# Patient Record
Sex: Male | Born: 1980 | Race: White | Hispanic: No | Marital: Married | Smoking: Current every day smoker
Health system: Southern US, Community
[De-identification: ages and names within clinical notes are randomized; demographics above are authoritative.]

## PROBLEM LIST (undated history)

## (undated) HISTORY — PX: OTHER SURGICAL HISTORY: SHX169

## (undated) HISTORY — PX: ABDOMINAL SURGERY: SHX537

---

## 1998-06-25 ENCOUNTER — Emergency Department (HOSPITAL_COMMUNITY): Admission: EM | Admit: 1998-06-25 | Discharge: 1998-06-25 | Payer: Self-pay | Admitting: Emergency Medicine

## 1998-09-18 ENCOUNTER — Inpatient Hospital Stay (HOSPITAL_COMMUNITY): Admission: RE | Admit: 1998-09-18 | Discharge: 1998-09-23 | Payer: Self-pay | Admitting: General Surgery

## 1998-09-18 ENCOUNTER — Encounter: Payer: Self-pay | Admitting: General Surgery

## 1999-03-22 ENCOUNTER — Emergency Department (HOSPITAL_COMMUNITY): Admission: EM | Admit: 1999-03-22 | Discharge: 1999-03-22 | Payer: Self-pay | Admitting: Emergency Medicine

## 1999-03-22 ENCOUNTER — Encounter: Payer: Self-pay | Admitting: Emergency Medicine

## 2008-05-30 ENCOUNTER — Emergency Department (HOSPITAL_COMMUNITY): Admission: EM | Admit: 2008-05-30 | Discharge: 2008-05-31 | Payer: Self-pay | Admitting: Emergency Medicine

## 2008-08-30 ENCOUNTER — Emergency Department (HOSPITAL_COMMUNITY): Admission: EM | Admit: 2008-08-30 | Discharge: 2008-08-30 | Payer: Self-pay | Admitting: Emergency Medicine

## 2009-03-01 ENCOUNTER — Emergency Department (HOSPITAL_COMMUNITY): Admission: EM | Admit: 2009-03-01 | Discharge: 2009-03-01 | Payer: Self-pay | Admitting: Emergency Medicine

## 2009-03-05 ENCOUNTER — Emergency Department (HOSPITAL_COMMUNITY): Admission: EM | Admit: 2009-03-05 | Discharge: 2009-03-05 | Payer: Self-pay | Admitting: Family Medicine

## 2010-03-02 ENCOUNTER — Emergency Department (HOSPITAL_COMMUNITY): Admission: EM | Admit: 2010-03-02 | Discharge: 2010-03-02 | Payer: Self-pay | Admitting: Emergency Medicine

## 2010-03-04 ENCOUNTER — Ambulatory Visit: Payer: Self-pay | Admitting: Vascular Surgery

## 2010-03-04 ENCOUNTER — Inpatient Hospital Stay (HOSPITAL_COMMUNITY): Admission: AD | Admit: 2010-03-04 | Discharge: 2010-03-09 | Payer: Self-pay | Admitting: Orthopedic Surgery

## 2010-03-04 ENCOUNTER — Encounter (INDEPENDENT_AMBULATORY_CARE_PROVIDER_SITE_OTHER): Payer: Self-pay | Admitting: Orthopedic Surgery

## 2010-12-21 LAB — BASIC METABOLIC PANEL
BUN: 7 mg/dL (ref 6–23)
CO2: 26 mEq/L (ref 19–32)
Calcium: 8.6 mg/dL (ref 8.4–10.5)
Creatinine, Ser: 0.94 mg/dL (ref 0.4–1.5)
GFR calc Af Amer: >60 mL/min (ref >60)
GFR calc non Af Amer: >60 mL/min (ref >60)
Potassium: 4.2 mEq/L (ref 3.5–5.1)

## 2010-12-21 LAB — ETHANOL: Alcohol, Ethyl (B): 93 mg/dL — ABNORMAL HIGH (ref 0–10)

## 2010-12-21 LAB — CBC
HCT: 34.8 % — ABNORMAL LOW (ref 39.0–52.0)
HCT: 41.9 % (ref 39.0–52.0)
Hemoglobin: 12.1 g/dL — ABNORMAL LOW (ref 13.0–17.0)
MCHC: 34.7 g/dL (ref 30.0–36.0)
MCV: 99.7 fL (ref 78.0–100.0)
MCV: 99.3 fL (ref 78.0–100.0)
Platelets: 168 10*3/uL (ref 150–400)
Platelets: 249 10*3/uL (ref 150–400)
RBC: 3.49 MIL/uL — ABNORMAL LOW (ref 4.22–5.81)
RBC: 4.22 MIL/uL (ref 4.22–5.81)
RDW: 13.1 % (ref 11.5–15.5)
WBC: 9.8 10*3/uL (ref 4.0–10.5)

## 2010-12-21 LAB — COMPREHENSIVE METABOLIC PANEL
ALT: 15 U/L (ref 0–53)
AST: 22 U/L (ref 0–37)
Alkaline Phosphatase: 58 U/L (ref 39–117)
BUN: 8 mg/dL (ref 6–23)
CO2: 24 mEq/L (ref 19–32)
GFR calc Af Amer: >60 mL/min (ref >60)
GFR calc non Af Amer: >60 mL/min (ref >60)
Potassium: 3.3 mEq/L — ABNORMAL LOW (ref 3.5–5.1)
Sodium: 140 mEq/L (ref 135–145)
Total Bilirubin: 0.8 mg/dL (ref 0.3–1.2)
Total Protein: 7 g/dL (ref 6.0–8.3)

## 2010-12-21 LAB — DIFFERENTIAL
Eosinophils Absolute: 0.4 10*3/uL (ref 0.0–0.7)
Eosinophils Relative: 4 % (ref 0–5)
Lymphocytes Relative: 51 % — ABNORMAL HIGH (ref 12–46)
Lymphs Abs: 5.6 10*3/uL — ABNORMAL HIGH (ref 0.7–4.0)
Monocytes Absolute: 0.7 10*3/uL (ref 0.1–1.0)
Monocytes Relative: 6 % (ref 3–12)
Neutrophils Relative %: 39 % — ABNORMAL LOW (ref 43–77)

## 2010-12-21 LAB — PROTIME-INR
INR: 1.05 (ref 0.00–1.49)
Prothrombin Time: 13.6 seconds (ref 11.6–15.2)

## 2010-12-21 LAB — URINALYSIS, ROUTINE W REFLEX MICROSCOPIC
Bilirubin Urine: NEGATIVE
Ketones, ur: NEGATIVE mg/dL

## 2011-03-13 ENCOUNTER — Emergency Department (HOSPITAL_COMMUNITY)
Admission: EM | Admit: 2011-03-13 | Discharge: 2011-03-13 | Disposition: A | Discharge status: Home / Self Care | Payer: 59 | Attending: Emergency Medicine | Admitting: Emergency Medicine

## 2011-03-13 ENCOUNTER — Emergency Department (HOSPITAL_COMMUNITY): Payer: 59

## 2011-03-13 DIAGNOSIS — R234 Changes in skin texture: Secondary | ICD-10-CM | POA: Insufficient documentation

## 2011-03-13 DIAGNOSIS — R42 Dizziness and giddiness: Secondary | ICD-10-CM | POA: Insufficient documentation

## 2011-03-13 DIAGNOSIS — S0003XA Contusion of scalp, initial encounter: Secondary | ICD-10-CM | POA: Insufficient documentation

## 2011-03-13 DIAGNOSIS — R51 Headache: Secondary | ICD-10-CM | POA: Insufficient documentation

## 2011-03-13 DIAGNOSIS — Y9229 Other specified public building as the place of occurrence of the external cause: Secondary | ICD-10-CM | POA: Insufficient documentation

## 2011-03-13 DIAGNOSIS — S0990XA Unspecified injury of head, initial encounter: Secondary | ICD-10-CM | POA: Insufficient documentation

## 2011-03-13 DIAGNOSIS — R221 Localized swelling, mass and lump, neck: Secondary | ICD-10-CM | POA: Insufficient documentation

## 2011-03-13 DIAGNOSIS — IMO0002 Reserved for concepts with insufficient information to code with codable children: Secondary | ICD-10-CM | POA: Insufficient documentation

## 2011-03-13 DIAGNOSIS — S1093XA Contusion of unspecified part of neck, initial encounter: Secondary | ICD-10-CM | POA: Insufficient documentation

## 2011-03-13 DIAGNOSIS — R220 Localized swelling, mass and lump, head: Secondary | ICD-10-CM | POA: Insufficient documentation

## 2011-10-05 IMAGING — RF DG TIBIA/FIBULA 2V*L*
1 series · 4 of 4 positions shown · non-contrast
Comparison: 03/04/2010.

CLINICAL DATA: ORIF left tibial plateau fracture.

LEFT TIBIA AND FIBULA - 2 VIEW

[Series 1: run · 4 of 4 slices shown]
[im 1/4]
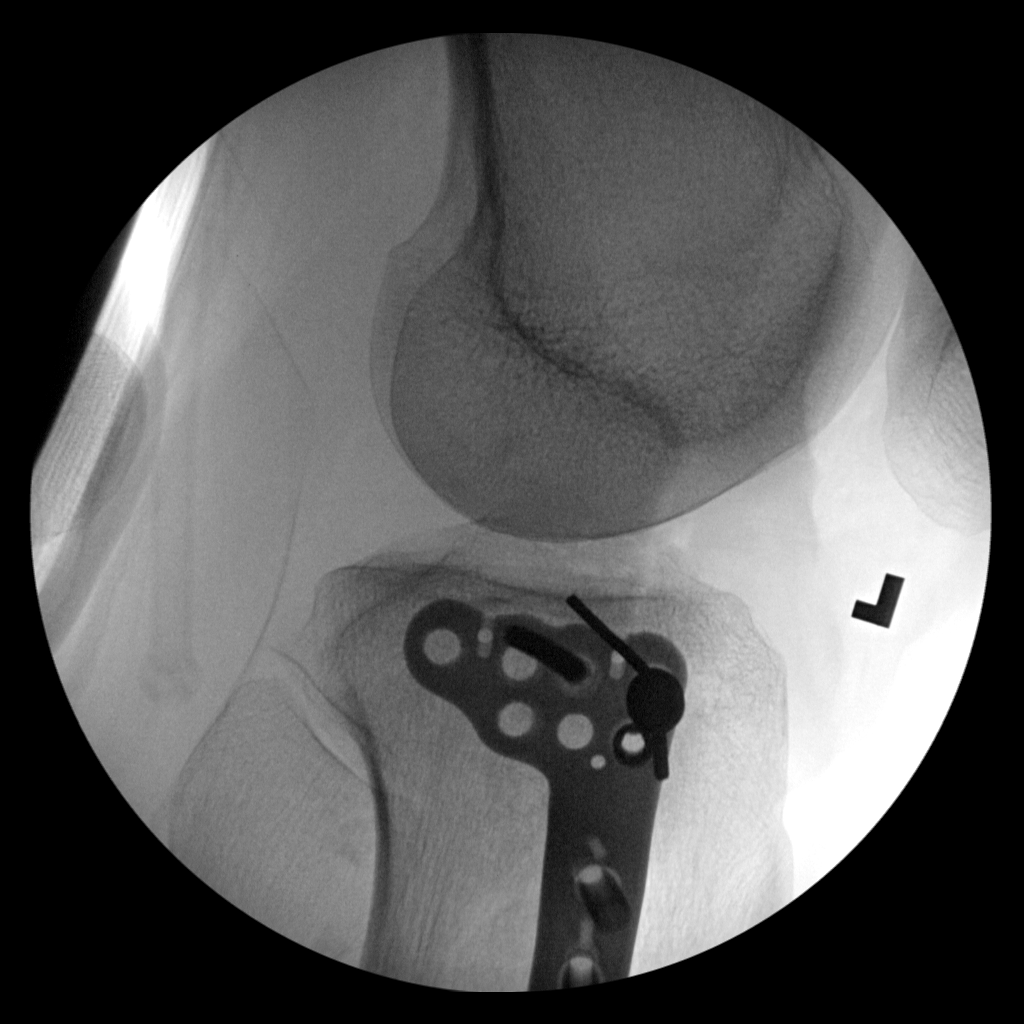
[im 2/4]
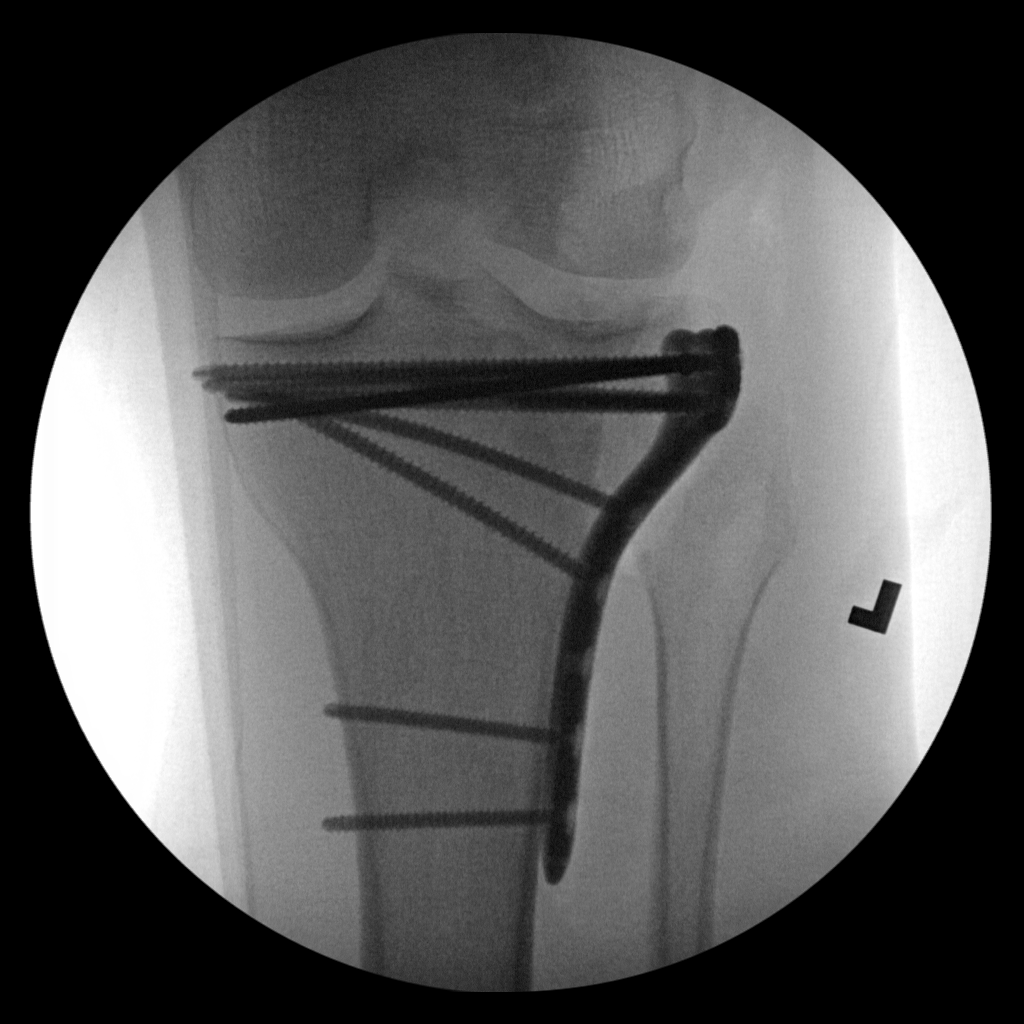
[im 3/4]
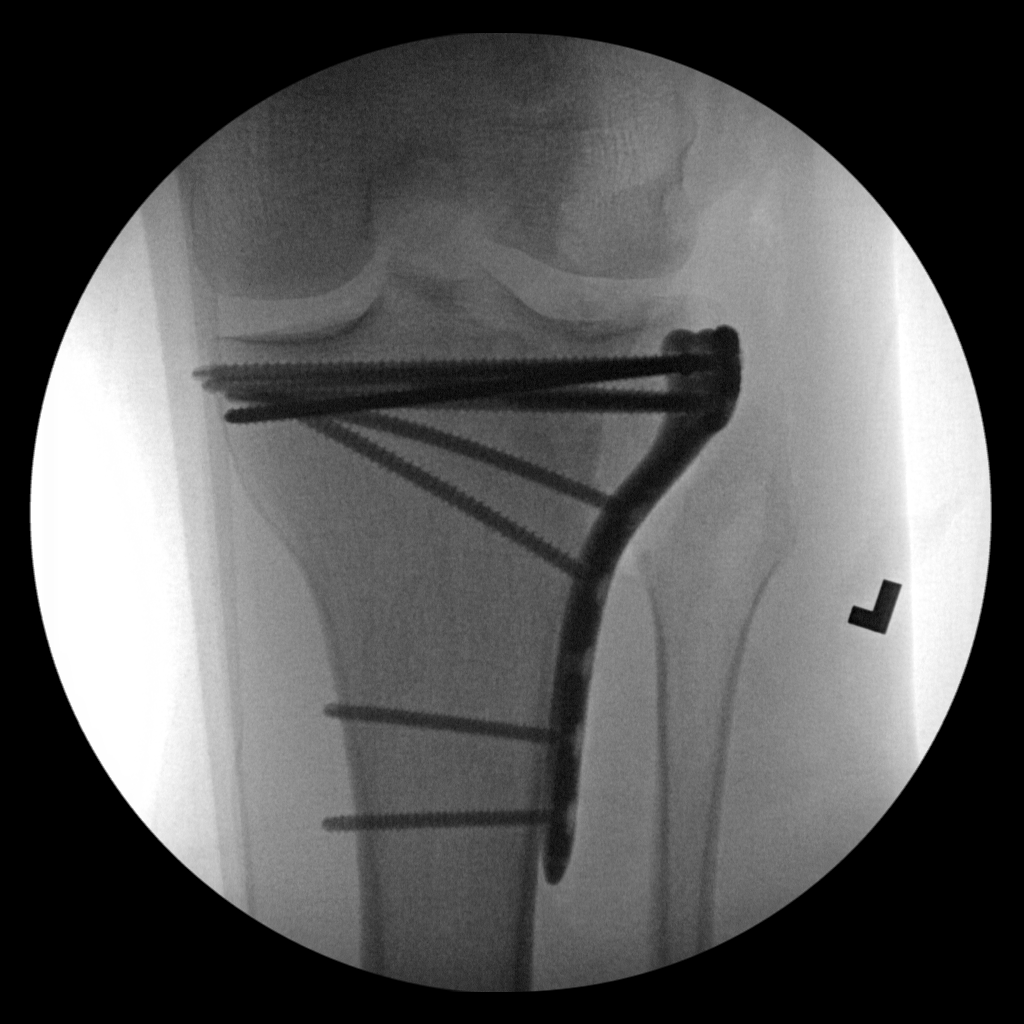
[im 4/4]
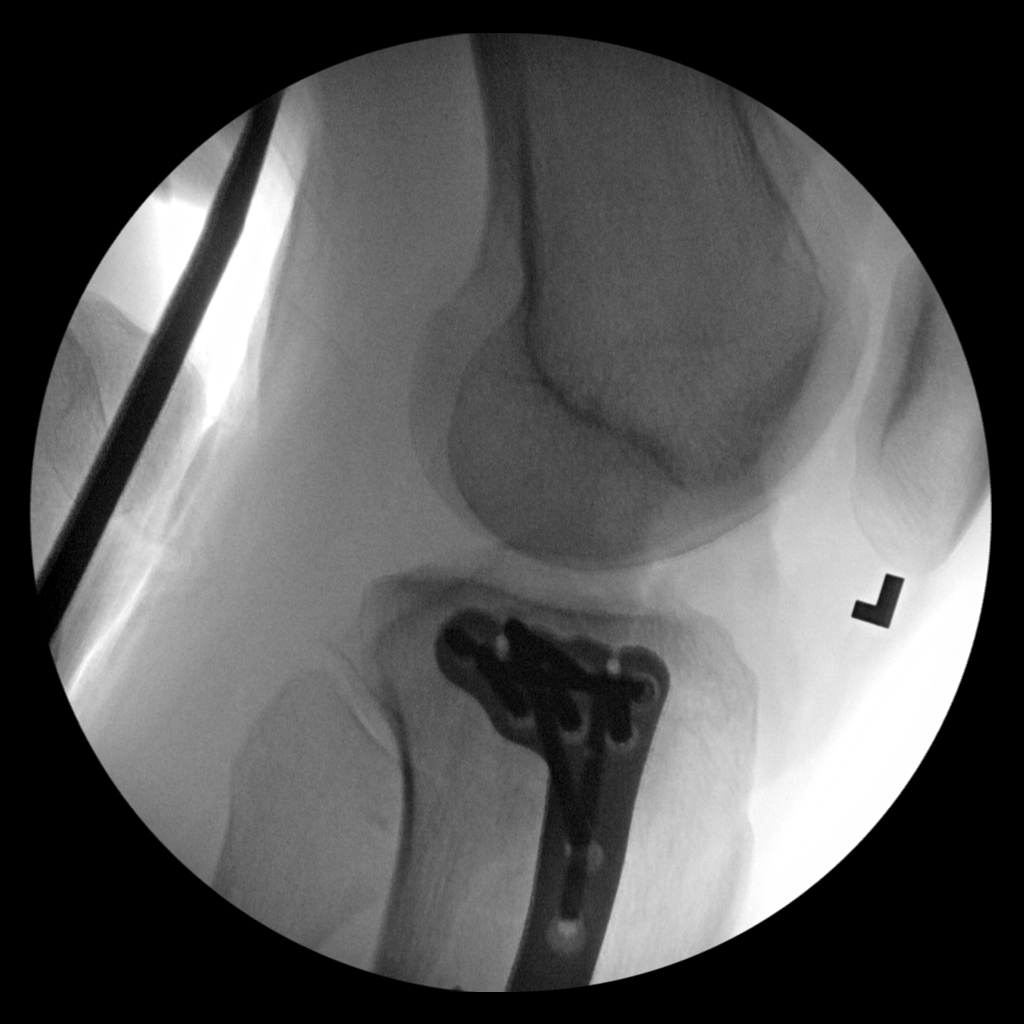

[4 of 4 positions shown; findings below may reference images not displayed]

FINDINGS: Intraoperative fluoroscopic spot views of the left
proximal tibia show lateral plate and screw fixation of a lateral
tibial plateau fracture.  Osseous detail is degraded by technique.
IMPRESSION: ORIF lateral tibial plateau fracture.
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
## 2012-11-02 ENCOUNTER — Encounter (HOSPITAL_COMMUNITY): Payer: Self-pay | Admitting: Emergency Medicine

## 2012-11-02 ENCOUNTER — Emergency Department (HOSPITAL_COMMUNITY)
Admission: EM | Admit: 2012-11-02 | Discharge: 2012-11-02 | Disposition: A | Discharge status: Home / Self Care | Payer: 59 | Source: Home / Self Care | Attending: Emergency Medicine | Admitting: Emergency Medicine

## 2012-11-02 DIAGNOSIS — S058X9A Other injuries of unspecified eye and orbit, initial encounter: Secondary | ICD-10-CM

## 2012-11-02 DIAGNOSIS — S0502XA Injury of conjunctiva and corneal abrasion without foreign body, left eye, initial encounter: Secondary | ICD-10-CM

## 2012-11-02 MED ORDER — MOXIFLOXACIN HCL 0.5 % OP SOLN: 1.0000 [drp] | Freq: Three times a day (TID) | OPHTHALMIC | Status: DC

## 2012-11-02 MED ORDER — OXYCODONE-ACETAMINOPHEN 5-325 MG PO TABS: 1.0000 | ORAL_TABLET | ORAL | Status: DC | PRN

## 2012-11-02 MED ORDER — TETRACAINE HCL 0.5 % OP SOLN
OPHTHALMIC | Status: AC
  Filled 2012-11-02: qty 2

## 2012-11-02 NOTE — ED Provider Notes (Signed)
Medical screening examination/treatment/procedure(s) were performed by non-physician practitioner and as supervising physician I was immediately available for consultation/collaboration.  Krisi Azua, M.D.   Cordai Rodrigue C Carollee Nussbaumer, MD 11/02/12 2054 

## 2012-11-02 NOTE — ED Provider Notes (Signed)
History     CSN: [id number]  Arrival date & time 11/02/12  1714   First MD Initiated Contact with Patient 11/02/12 1950      Chief Complaint  Patient presents with  . Eye Injury    injury to left eye when putting copper wire into van.     (Consider location/radiation/quality/duration/timing/severity/associated sxs/prior treatment) HPI Comments: Pt scratched his L eye with copper wiring at the end of the work day.   Patient is a 32 y.o. male presenting with eye injury. The history is provided by the patient.  Eye Injury This is a new problem. The current episode started 3 to 5 hours ago. The problem occurs constantly. The problem has not changed since onset.Nothing aggravates the symptoms. Nothing relieves the symptoms. He has tried nothing for the symptoms.    History reviewed. No pertinent past medical history.  History reviewed. No pertinent past surgical history.  History reviewed. No pertinent family history.  History  Substance Use Topics  . Smoking status: Current Every Day Smoker -- 1.0 packs/day    Types: Cigarettes  . Smokeless tobacco: Not on file  . Alcohol Use: Yes      Review of Systems  Eyes: Positive for pain, redness and visual disturbance. Negative for photophobia and discharge.    Allergies  Review of patient's allergies indicates no known allergies.  Home Medications   Current Outpatient Rx  Name  Route  Sig  Dispense  Refill  . MOXIFLOXACIN HCL 0.5 % OP SOLN   Left Eye   Place 1 drop into the left eye 3 (three) times daily.   3 mL   0   . OXYCODONE-ACETAMINOPHEN 5-325 MG PO TABS   Oral   Take 1-2 tablets by mouth every 4 (four) hours as needed for pain.   10 tablet   0     BP 131/94  Pulse 82  Temp 97.8 F (36.6 C) (Oral)  Resp 18  SpO2 98%  Physical Exam  Constitutional: He appears well-developed and well-nourished.       Appears in pain  Eyes: EOM and lids are normal. Pupils are equal, round, and reactive to light. Left  eye exhibits no discharge and no exudate. No foreign body present in the left eye. Right conjunctiva is not injected. Right conjunctiva has no hemorrhage. Left conjunctiva is injected. Left conjunctiva has no hemorrhage.  Slit lamp exam:      The left eye shows corneal abrasion and fluorescein uptake.      ED Course  Procedures (including critical care time)  Labs Reviewed - No data to display No results found.   1. Corneal abrasion, left       MDM  Pt to f/u with opthamology Monday.        Cathlyn Parsons, NP 11/02/12 2008

## 2012-11-02 NOTE — ED Notes (Signed)
Pt states that he injured his left eye when putting copper wire into his van. Wire popped back scrapping left eye.  Pt is unable to see out of eye clearly and is having a lot of pain.  Left eye is red and running tears.

## 2012-11-18 ENCOUNTER — Other Ambulatory Visit: Payer: Self-pay

## 2013-08-09 ENCOUNTER — Other Ambulatory Visit: Payer: Self-pay

## 2013-11-05 ENCOUNTER — Emergency Department (HOSPITAL_COMMUNITY)
Admission: EM | Admit: 2013-11-05 | Discharge: 2013-11-06 | Disposition: A | Discharge status: Home / Self Care | Payer: 59 | Attending: Emergency Medicine | Admitting: Emergency Medicine

## 2013-11-05 ENCOUNTER — Encounter (HOSPITAL_COMMUNITY): Payer: Self-pay | Admitting: Emergency Medicine

## 2013-11-05 DIAGNOSIS — F17200 Nicotine dependence, unspecified, uncomplicated: Secondary | ICD-10-CM | POA: Insufficient documentation

## 2013-11-05 DIAGNOSIS — F1010 Alcohol abuse, uncomplicated: Secondary | ICD-10-CM | POA: Insufficient documentation

## 2013-11-05 DIAGNOSIS — R748 Abnormal levels of other serum enzymes: Secondary | ICD-10-CM | POA: Insufficient documentation

## 2013-11-05 LAB — RAPID URINE DRUG SCREEN, HOSP PERFORMED
Amphetamines: NOT DETECTED
Barbiturates: NOT DETECTED
Benzodiazepines: NOT DETECTED
COCAINE: POSITIVE — AB
OPIATES: NOT DETECTED
Tetrahydrocannabinol: NOT DETECTED

## 2013-11-05 LAB — CBC
HCT: 49.7 % (ref 39.0–52.0)
Hemoglobin: 17.9 g/dL — ABNORMAL HIGH (ref 13.0–17.0)
MCH: 36.6 pg — ABNORMAL HIGH (ref 26.0–34.0)
MCHC: 36 g/dL (ref 30.0–36.0)
MCV: 101.6 fL — ABNORMAL HIGH (ref 78.0–100.0)
Platelets: 263 10*3/uL (ref 150–400)
RBC: 4.89 MIL/uL (ref 4.22–5.81)
RDW: 12.4 % (ref 11.5–15.5)
WBC: 10.4 10*3/uL (ref 4.0–10.5)

## 2013-11-05 LAB — ACETAMINOPHEN LEVEL

## 2013-11-05 LAB — COMPREHENSIVE METABOLIC PANEL
ALK PHOS: 81 U/L (ref 39–117)
ALT: 59 U/L — AB (ref 0–53)
AST: 57 U/L — AB (ref 0–37)
Albumin: 4.4 g/dL (ref 3.5–5.2)
BILIRUBIN TOTAL: 0.2 mg/dL — AB (ref 0.3–1.2)
BUN: 9 mg/dL (ref 6–23)
CALCIUM: 9.6 mg/dL (ref 8.4–10.5)
CO2: 23 mEq/L (ref 19–32)
Chloride: 101 mEq/L (ref 96–112)
Creatinine, Ser: 1.1 mg/dL (ref 0.50–1.35)
GFR calc Af Amer: >90 mL/min (ref >90)
GFR, EST NON AFRICAN AMERICAN: 86 mL/min — AB (ref >90)
Glucose, Bld: 97 mg/dL (ref 70–99)
Potassium: 4.6 mEq/L (ref 3.7–5.3)
Sodium: 141 mEq/L (ref 137–147)
Total Protein: 8.1 g/dL (ref 6.0–8.3)

## 2013-11-05 LAB — SALICYLATE LEVEL

## 2013-11-05 LAB — ETHANOL: ALCOHOL ETHYL (B): 267 mg/dL — AB (ref 0–11)

## 2013-11-05 MED ORDER — LORAZEPAM 1 MG PO TABS: 0.0000 mg | ORAL_TABLET | Freq: Four times a day (QID) | ORAL | Status: DC

## 2013-11-05 MED ORDER — VITAMIN B-1 100 MG PO TABS
100.0000 mg | ORAL_TABLET | Freq: Every day | ORAL | Status: DC
  Administered 2013-11-05: 100 mg via ORAL
  Filled 2013-11-05: qty 1

## 2013-11-05 MED ORDER — ADULT MULTIVITAMIN W/MINERALS CH
1.0000 | ORAL_TABLET | Freq: Every day | ORAL | Status: DC
  Administered 2013-11-05: 1 via ORAL
  Filled 2013-11-05: qty 1

## 2013-11-05 MED ORDER — LORAZEPAM 1 MG PO TABS: 0.0000 mg | ORAL_TABLET | Freq: Two times a day (BID) | ORAL | Status: DC

## 2013-11-05 MED ORDER — FOLIC ACID 1 MG PO TABS
1.0000 mg | ORAL_TABLET | Freq: Every day | ORAL | Status: DC
  Administered 2013-11-05: 1 mg via ORAL
  Filled 2013-11-05: qty 1

## 2013-11-05 MED ORDER — THIAMINE HCL 100 MG/ML IJ SOLN: 100.0000 mg | Freq: Every day | INTRAMUSCULAR | Status: DC

## 2013-11-05 MED ORDER — NICOTINE 21 MG/24HR TD PT24: 21.0000 mg | MEDICATED_PATCH | Freq: Every day | TRANSDERMAL | Status: DC

## 2013-11-05 NOTE — ED Provider Notes (Signed)
CSN: [id number]     Arrival date & time 11/05/13  1737 History   First MD Initiated Contact with Patient 11/05/13 1834     Chief Complaint  Patient presents with  . Medical Clearance   (Consider location/radiation/quality/duration/timing/severity/associated sxs/prior Treatment) HPI 33 year old male with history of heavy alcohol use has never been diagnosed with alcoholism has never gone through detox last drink was about a day and half ago he has no shakes no tremors no anxiety no vomiting no hallucinating no confusion but wants detox outpatient treatment if possible denies suicidal or homicidal ideation denies hallucinations denies other recent illnesses or other medical conditions. History reviewed. No pertinent past medical history. Past Surgical History  Procedure Laterality Date  . Abdominal surgery      2001  . Ruptured spleen      2012   History reviewed. No pertinent family history. History  Substance Use Topics  . Smoking status: Current Every Day Smoker -- 1.00 packs/day    Types: Cigarettes  . Smokeless tobacco: Not on file  . Alcohol Use: Yes     Comment: beers daily    Review of Systems 10 Systems reviewed and are negative for acute change except as noted in the HPI. Allergies  Review of patient's allergies indicates no known allergies.  Home Medications  No current outpatient prescriptions on file. BP 134/91  Pulse 82  Temp(Src) 97.9 F (36.6 C) (Oral)  Resp 17  SpO2 99% Physical Exam  Nursing note and vitals reviewed. Constitutional:  Awake, alert, nontoxic appearance.  HENT:  Head: Atraumatic.  Eyes: Right eye exhibits no discharge. Left eye exhibits no discharge.  Neck: Neck supple.  Cardiovascular: Normal rate and regular rhythm.   No murmur heard. Pulmonary/Chest: Effort normal and breath sounds normal. No respiratory distress. He has no wheezes. He has no rales. He exhibits no tenderness.  Abdominal: Soft. Bowel sounds are normal. He exhibits no  distension. There is no tenderness. There is no rebound and no guarding.  Musculoskeletal: He exhibits no tenderness.  Baseline ROM, no obvious new focal weakness.  Neurological: He is alert.  Mental status and motor strength appears baseline for patient and situation.  Skin: No rash noted.  Psychiatric: He has a normal mood and affect.    ED Course  Procedures (including critical care time) Patient / Family / Caregiver understand and agree with initial ED impression and plan with expectations set for ED visit. Pt debated multiple times about his desire for detox. Pt seen by TTS trying to find program for detox for Pt. 0015 Labs Review Labs Reviewed  CBC - Abnormal; Notable for the following:    Hemoglobin 17.9 (*)    MCV 101.6 (*)    MCH 36.6 (*)    All other components within normal limits  COMPREHENSIVE METABOLIC PANEL - Abnormal; Notable for the following:    AST 57 (*)    ALT 59 (*)    Total Bilirubin 0.2 (*)    GFR calc non Af Amer 86 (*)    All other components within normal limits  ETHANOL - Abnormal; Notable for the following:    Alcohol, Ethyl (B) 267 (*)    All other components within normal limits  SALICYLATE LEVEL - Abnormal; Notable for the following:    Salicylate Lvl <2.0 (*)    All other components within normal limits  URINE RAPID DRUG SCREEN (HOSP PERFORMED) - Abnormal; Notable for the following:    Cocaine POSITIVE (*)    All  other components within normal limits  ACETAMINOPHEN LEVEL   Imaging Review No results found.  EKG Interpretation   None       MDM   1. Alcohol abuse   2. Elevated liver enzymes    Dispo pending.    Hurman HornJohn M Mareta Chesnut, MD 11/09/13 [phone]

## 2013-11-05 NOTE — ED Notes (Signed)
Pt has been to nurses station multiple times to use telephone. Explained to pt that he had to ask to use phone. Pt states that he wanted to leave d/t not being seen by anyone. Explained to pt that he had been seen by the dr and TTS and pt walks off. Pt calls a family member (father) and father present to triage area. Dr Fonnie JarvisBednar and TTS were both present with family. Family decided that he was going to stay and family left. After family left pt changed his mind and want to leave. Pt left triage area and was standing out side. Pt then came back in and was using telephone. Explained to pt that if he was going to stay that he could not leave out and that if he was going to leave that someone had to come in and get him and we had to see them. Pt reports that his father was on the way to get him. Spoke with Dr. Fonnie JarvisBednar who stated that pt can be discharged when ride comes in to get him.

## 2013-11-05 NOTE — ED Notes (Signed)
Spoke with pt and pt has agreed to stay for treatment. Pt to placed in secured area.

## 2013-11-05 NOTE — ED Notes (Signed)
Pt reports wanting detox from ETOH. Last drink 1.5 days ago. Has been drinking over 15 years.  Reports usually drinks a few beers a day. Denies SI/HI. Never been to detox before
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
## 2013-11-06 NOTE — Discharge Instructions (Signed)
°Emergency Department Resource Guide °1) Find a Doctor and Pay Out of Pocket °Although you won't have to find out who is covered by your insurance plan, it is a good idea to ask around and get recommendations. You will then need to call the office and see if the doctor you have chosen will accept you as a new patient and what types of options they offer for patients who are self-pay. Some doctors offer discounts or will set up payment plans for their patients who do not have insurance, but you will need to ask so you aren't surprised when you get to your appointment. ° °2) Contact Your Local Health Department °Not all health departments have doctors that can see patients for sick visits, but many do, so it is worth a call to see if yours does. If you don't know where your local health department is, you can check in your phone book. The CDC also has a tool to help you locate your state's health department, and many state websites also have listings of all of their local health departments. ° °3) Find a Walk-in Clinic °If your illness is not likely to be very severe or complicated, you may want to try a walk in clinic. These are popping up all over the country in pharmacies, drugstores, and shopping centers. They're usually staffed by nurse practitioners or physician assistants that have been trained to treat common illnesses and complaints. They're usually fairly quick and inexpensive. However, if you have serious medical issues or chronic medical problems, these are probably not your best option. ° °No Primary Care Doctor: °- Call [facility] at  832-8000 - they can help you locate a primary care doctor that  accepts your insurance, provides certain services, etc. °- Physician Referral Service- 1-800-533-3463 ° °Chronic Pain Problems: °Organization         Address  Phone   Notes  °[facility]  (336) 297-2271 Patients need to be referred by their primary care doctor.  ° °Medication  Assistance: °Organization         Address  Phone   Notes  °[place] [facility] [address] °[place], [place] [address] (336) 641-8030 --Must be a resident of [place] °-- Must have NO insurance coverage whatsoever (no Medicaid/ Medicare, etc.) °-- The pt. MUST have a primary care doctor that directs their care regularly and follows them in the community °  °[facility]  (866) 331-1348   °[employer]  (888) 892-1162   ° °Agencies that provide inexpensive medical care: °Organization         Address  Phone   Notes  °[facility]  (336) 832-8035   °[facility]    (336) 832-7272   °[facility] [address] °[place], [place] [address] (336) 832-4777   °[facility] [address], °[place] (336) 271-4999   °[facility]    (336) 373-0678   °[facility]    (336) 272-1050   °[facility] ° [address], [place] Phone:  (336) 832-4444, Fax:  (336) 832-4440 Hours of Operation:  9 am - 6 pm, M-F.  Also accepts Medicaid/Medicare and self-pay.  ° [facility] ° [address], [place] Phone: (336) 832-3150, Fax: (336) 832-3151. Hours of Operation:  8:30 am - 5:30 pm, M-F.  Also accepts Medicaid and self-pay.  °[facility] [place] [address], [place] Phone: (336) 878-6027   °[facility] [address], [place], [place] (336)723-1848, Ext. 123 Mondays & Thursdays: 7-9 AM.  First 15 patients are seen on a first come, first serve basis. °  ° °Medicaid-accepting [place] Providers: ° °Organization         Address  Phone   Notes  °[facility] [address], [place] (336) 641-2100 Also accepts self-pay patients.  °[facility] [address], [place] ° (336) 856-9996   °[facility] [address], [place]  (336) 288-8857   °[facility] [address] [place] [address], [place] (336) 299-7000   °Veita Bland [address], [place]  ° (336) 373-1557 Only accepts [place] Access Medicaid patients after they have their name applied to their card.  ° °Self-Pay (no insurance) in [place]: ° °Organization         Address  Phone   Notes  °Sickle Cell Patients, [facility] [address], [place] (336) 832-1970   °[facility] [address], [place] (336) 832-4400   °[facility] ° [address], [place] (336) 992-4800   °[facility]/Dr. Osei-Bonsu ° [address] [place] [address], [place] or [address], [place] (336) 841-8500 Phone number for both [place] and [place] locations is the same.  °[facility] [address], [place] (336) 299-0000   °[facility] [address] [place] [address], [place] or [address] (336) 852-7530 °(336) 878-2260   °[facility] [address], [place] (336) 350-1642, phone; (336) 294-5005, fax Sees patients 1st and 3rd Saturday of every month.  Must not qualify for public or private insurance (i.e. Medicaid, Medicare, [place] Health Choice, Veterans' Benefits) • Household income should be no more than 200% of the poverty level •The clinic cannot treat you if you are pregnant or think you are pregnant • Sexually transmitted diseases are not treated at the clinic.  ° ° °Dental Care: °Organization         Address  Phone  Notes  °[place] [facility] [address], [place] (336) 641-6152 Accepts children up to age 21 who are enrolled in Medicaid or [place] Health Choice; pregnant women with a Medicaid card; and children who have applied for Medicaid or [place] Health Choice, but were declined, whose parents can pay a reduced fee at time of service.  °[place]  [facility] [place]  [address], [place] (336) 641-7733 Accepts children up to age 21 who are enrolled in Medicaid or [place] Health Choice; pregnant women with a Medicaid card; and children who have applied for Medicaid or  Health Choice, but were declined, whose parents can pay a reduced fee at time of service.  °[facility] ° [address], [place] (336) 641-4533 Patients are seen by appointment only. Walk-ins are not accepted. [facility] will see patients 18 years of age and older. °Monday - Tuesday (8am-5pm) °Most Wednesdays (8:30-5pm) °$30 per visit, cash only  °[facility] ° [address], [place] (336) 641-4533 Patients are seen by appointment only. Walk-ins are not accepted. [facility] will see patients 18 years of age and older. °One   Wednesday Evening (Monthly: Volunteer Based).  $30 per visit, cash only  °[facility]  (919) 537-3737 for adults; Children under age 4, call Graduate Pediatric Dentistry at (919) [phone]. Children aged 4-14, please call (919) [phone] to request a pediatric application. ° Dental services are provided in all areas of dental care including fillings, crowns and bridges, complete and partial dentures, implants, gum treatment, root canals, and extractions. Preventive care is also provided. Treatment is provided to both adults and children. °Patients are selected via a lottery and there is often a waiting list. °  °[facility] [address], °[place] ° (336) 763-8833 [email or web address] °  °[facility] [address], [place], [place] (336)723-1848, Ext. 123 Second and Fourth Thursday of each month, opens at 6:30 AM; Clinic ends at 9 AM.  Patients are seen on a first-come first-served basis, and a limited number are seen during each clinic.  ° °[facility] ° [address], [place], [place] (336) 723-7904    Eligibility Requirements °You must have lived in [place], [place], or [place] counties for at least the last three months. °  You cannot be eligible for state or federal sponsored healthcare insurance, including Veterans Administration, Medicaid, or Medicare. °  You generally cannot be eligible for healthcare insurance through your employer.  °  How to apply: °Eligibility screenings are held every Tuesday and Wednesday afternoon from 1:00 pm until 4:00 pm. You do not need an appointment for the interview!  °[facility] [address], [place], [place] 336-631-2330   °[place] [facility]  336-342-8273   °[facility]  336-703-3100   °[facility]  336-570-6415   ° °Behavioral Health Resources in the Community: °Intensive Outpatient Programs °Organization         Address  Phone  Notes  °[facility] [address], [place], [place] 336-878-6098   °[facility] [address], [place], [place] 336-832-9800   °[facility] [address], [place], [place] ° 336-882-2125   °[facility] [address],  °[place], [place] 1-800-853-5163 or 336-641-4981   °Substance Abuse Resources °Organization         Address  Phone  Notes  °[facility]  336-882-2125   °[facility]  336-784-9470   °[facility]  336-285-9073   °[facility]  336-845-3988   °Residential & Outpatient Substance Abuse Program  1-800-659-3381   °Psychological Services °Organization         Address  Phone  Notes  °[facility]  336- 832-9600   °[facility]  336- 378-7881   °[facility] [address], [place] 1-800-853-5163 or 336-641-4981   ° °Mobile Crisis Teams °Organization         Address  Phone  Notes  °[facility], Mobile Crisis Care Unit  1-877-626-1772   °Assertive °[facility] ° [address]  [place], [place] 336-834-9664   °Sharon DeEsch [address] °[place] 336-554-5454   ° °Self-Help/Support Groups °Organization         Address  Phone             Notes  °[facility] [place] [facility] - variety of support groups  336- 373-1402 Call for more information  °Narcotics Anonymous (NA), [facility] [address], °[place]  2 meetings at this location  ° °  Residential Treatment Programs °Organization         Address  Phone  Notes  °[facility] [address],    °[place]  1-866-801-8205   °[facility] ° [address], [place], [place] 704-293-8524   °[facility] [address], [place] 336-845-3988 Admissions: 8am-3pm M-F  °[facility] [address]    °[place], [place] 336-841-1104   °[facility] [address], [place], [place] 336-379-7146   °[facility] [address]  °[place], [place] 336-285-9073   °[facility] - Intensive Outpatient [address], [place], [place] 336-852-3033   °[facility] [address]  °[place], [place] 1-877-615-2722 or 336-784-9470   °[facility] [address] [place], [place] 336-227-7417 Accepts Medicaid  °[facility] [address]  °[place] 1-800-659-3381 Substance Abuse/Addiction Treatment  ° °[place] Behavioral Health Resources °Organization         Address  Phone  Notes  °[facility]  (888) 581-9988   °Julie Brannon, PhD [address] [place], [place]   (336) 349-5553 or (336) 951-0000   °[facility]   [address] °[place], [place] (336) 349-4454   °[facility] [address], [place], [place] (336) 342-8316 Insurance/Medicaid/sponsorship through [facility]  °[facility] [address]                                    [place], [place] (336) 342-8316 Therapy/tele-psych/case    °[facility] [address]  ° [place],  (336) 349-2233    °Dr. Arfeen  (336) 349-4544   °[facility] [place]  [employer] [place] [facility] 1) [address],  °2) [address], [place] °3)  [address], [place] (336) 349-3220 °(336) 342-7768 ° °(336) 342-8140   °[place] Child Abuse Hotline (336) 342-1394 or (336) 342-3537 (After Hours)    ° ° °

## 2013-11-06 NOTE — Progress Notes (Signed)
[facility], stated that pt has been accepted.  Placed [facility] to make her aware and she will follow up [facility] nursing [facility] once she informs pt of acceptance, at that time they will give accepting MD and report number.  Number to call back is [phone]. RN can not give report until transportation is in the building to pick pt up.   Tomi BambergerMariya Lyfe Reihl Disposition MHT

## 2013-11-06 NOTE — ED Provider Notes (Signed)
6:49 AM The [facility] but declines and wishes to go home. We will discharge him home as per the plan discussed with Dr. Fonnie JarvisBednar.  Hanley SeamenJohn L Ardean Simonich, MD 11/06/13 [phone]

## 2013-11-06 NOTE — Progress Notes (Signed)
The following facilities have been contacted regarding inptx:  [facility]- no answer [facility]- per Dorathy DaftKayla 1 male bed low acuity available, [facility] Amy beds available, referral faxed [facility]- no answer [facility]- per Elonda Huskyassandra at capacity [facility]- per Jonny RuizJohn at capacity, will not have anything until after lunch [facility]- per Jasmine DecemberSharon beds available, referral faxed [facility]- [facility], [facility]- per Denny PeonErin at capacity [facility]- per Rosey Batheresa at capacity, wait list only [facility]- per Shanda BumpsJessica at capacity    [employer]ariya Edit Ricciardelli Disposition MHT

## 2013-11-06 NOTE — Progress Notes (Signed)
[facility]HT will refer patient to other hospitals.

## 2013-11-06 NOTE — Progress Notes (Signed)
[facility] was offered [facility] and declined stating that [facility].  [facility].  Patient was given outpatient resources for substance abuse.

## 2014-11-26 ENCOUNTER — Encounter (HOSPITAL_COMMUNITY): Payer: Self-pay | Admitting: *Deleted

## 2014-11-26 ENCOUNTER — Emergency Department (HOSPITAL_COMMUNITY)
Admission: EM | Admit: 2014-11-26 | Discharge: 2014-11-26 | Disposition: A | Discharge status: Home / Self Care | Payer: Self-pay | Attending: Emergency Medicine | Admitting: Emergency Medicine

## 2014-11-26 DIAGNOSIS — F10920 Alcohol use, unspecified with intoxication, uncomplicated: Secondary | ICD-10-CM

## 2014-11-26 DIAGNOSIS — Z792 Long term (current) use of antibiotics: Secondary | ICD-10-CM | POA: Insufficient documentation

## 2014-11-26 DIAGNOSIS — F10120 Alcohol abuse with intoxication, uncomplicated: Secondary | ICD-10-CM | POA: Insufficient documentation

## 2014-11-26 DIAGNOSIS — Z720 Tobacco use: Secondary | ICD-10-CM | POA: Insufficient documentation

## 2014-11-26 LAB — CBC
HCT: 47.6 % (ref 39.0–52.0)
Hemoglobin: 16.2 g/dL (ref 13.0–17.0)
MCH: 35.7 pg — ABNORMAL HIGH (ref 26.0–34.0)
MCHC: 34 g/dL (ref 30.0–36.0)
MCV: 104.8 fL — ABNORMAL HIGH (ref 78.0–100.0)
Platelets: 245 10*3/uL (ref 150–400)
RBC: 4.54 MIL/uL (ref 4.22–5.81)
RDW: 12.4 % (ref 11.5–15.5)
WBC: 7.5 10*3/uL (ref 4.0–10.5)

## 2014-11-26 LAB — COMPREHENSIVE METABOLIC PANEL
ALT: 163 U/L — ABNORMAL HIGH (ref 0–53)
AST: 141 U/L — ABNORMAL HIGH (ref 0–37)
Albumin: 4.4 g/dL (ref 3.5–5.2)
Alkaline Phosphatase: 64 U/L (ref 39–117)
Anion gap: 9 (ref 5–15)
BUN: <5 mg/dL — ABNORMAL LOW (ref 6–23)
CO2: 22 mmol/L (ref 19–32)
Calcium: 8.5 mg/dL (ref 8.4–10.5)
Chloride: 108 mmol/L (ref 96–112)
Creatinine, Ser: 0.76 mg/dL (ref 0.50–1.35)
GFR calc Af Amer: >90 mL/min (ref >90)
GFR calc non Af Amer: >90 mL/min (ref >90)
Glucose, Bld: 117 mg/dL — ABNORMAL HIGH (ref 70–99)
Potassium: 3.4 mmol/L — ABNORMAL LOW (ref 3.5–5.1)
Sodium: 139 mmol/L (ref 135–145)
Total Bilirubin: 0.5 mg/dL (ref 0.3–1.2)
Total Protein: 7.9 g/dL (ref 6.0–8.3)

## 2014-11-26 LAB — SALICYLATE LEVEL: Salicylate Lvl: <4 mg/dL (ref 2.8–20.0)

## 2014-11-26 LAB — RAPID URINE DRUG SCREEN, HOSP PERFORMED
Amphetamines: NOT DETECTED
Barbiturates: NOT DETECTED
Benzodiazepines: NOT DETECTED
Cocaine: NOT DETECTED
Opiates: NOT DETECTED
Tetrahydrocannabinol: NOT DETECTED

## 2014-11-26 LAB — ETHANOL: Alcohol, Ethyl (B): 303 mg/dL — ABNORMAL HIGH (ref 0–9)

## 2014-11-26 LAB — ACETAMINOPHEN LEVEL: Acetaminophen (Tylenol), Serum: <10 ug/mL — ABNORMAL LOW (ref 10–30)

## 2014-11-26 NOTE — ED Notes (Signed)
Pt wanted to leave and go outside. Pt was told he is not allowed to. Pt was in the bathroom changing, rn went into bathroom after wards and smelled smoked,  smoked cigarettes in the bathroom. Pt admits to smoking cigarettes in bathroom.

## 2014-11-26 NOTE — ED Notes (Signed)
Pt standing in doorway walking around.

## 2014-11-26 NOTE — ED Notes (Addendum)
Pt angry and demanding and asking for his belongings asap. Pt states "I can just do this shit at home, they ain't doing nothing for me here!". Dr. Juleen ChinaKohut notified and is at bedside to address patient concerns.

## 2014-11-26 NOTE — Discharge Instructions (Signed)
°Emergency Department Resource Guide °1) Find a Doctor and Pay Out of Pocket °Although you won't have to find out who is covered by your insurance plan, it is a good idea to ask around and get recommendations. You will then need to call the office and see if the doctor you have chosen will accept you as a new patient and what types of options they offer for patients who are self-pay. Some doctors offer discounts or will set up payment plans for their patients who do not have insurance, but you will need to ask so you aren't surprised when you get to your appointment. ° °2) Contact Your Local Health Department °Not all health departments have doctors that can see patients for sick visits, but many do, so it is worth a call to see if yours does. If you don't know where your local health department is, you can check in your phone book. The CDC also has a tool to help you locate your state's health department, and many state websites also have listings of all of their local health departments. ° °3) Find a Walk-in Clinic °If your illness is not likely to be very severe or complicated, you may want to try a walk in clinic. These are popping up all over the country in pharmacies, drugstores, and shopping centers. They're usually staffed by nurse practitioners or physician assistants that have been trained to treat common illnesses and complaints. They're usually fairly quick and inexpensive. However, if you have serious medical issues or chronic medical problems, these are probably not your best option. ° °No Primary Care Doctor: °- Call [facility] at  832-8000 - they can help you locate a primary care doctor that  accepts your insurance, provides certain services, etc. °- Physician Referral Service- 1-800-533-3463 ° °Chronic Pain Problems: °Organization         Address  Phone   Notes  °[facility]  (336) 297-2271 Patients need to be referred by their primary care doctor.  ° °Medication  Assistance: °Organization         Address  Phone   Notes  °[place] [facility] [address] °[place], [place] [address] (336) 641-8030 --Must be a resident of [place] °-- Must have NO insurance coverage whatsoever (no Medicaid/ Medicare, etc.) °-- The pt. MUST have a primary care doctor that directs their care regularly and follows them in the community °  °[facility]  (866) 331-1348   °[employer]  (888) 892-1162   ° °Agencies that provide inexpensive medical care: °Organization         Address  Phone   Notes  °[facility]  (336) 832-8035   °[facility]    (336) 832-7272   °[facility] [address] °[place], [place] [address] (336) 832-4777   °[facility] [address], °[place] (336) 271-4999   °[facility]    (336) 373-0678   °[facility]    (336) 272-1050   °[facility] ° [address], [place] Phone:  (336) 832-4444, Fax:  (336) 832-4440 Hours of Operation:  9 am - 6 pm, M-F.  Also accepts Medicaid/Medicare and self-pay.  ° [facility] ° [address], [place] Phone: (336) 832-3150, Fax: (336) 832-3151. Hours of Operation:  8:30 am - 5:30 pm, M-F.  Also accepts Medicaid and self-pay.  °[facility] [place] [address], [place] Phone: (336) 878-6027   °[facility] [address], [place], [place] (336)723-1848, Ext. 123 Mondays & Thursdays: 7-9 AM.  First 15 patients are seen on a first come, first serve basis. °  ° °Medicaid-accepting [place] Providers: ° °Organization         Address  Phone   Notes  °[facility] [address], [place] (336) 641-2100 Also accepts self-pay patients.  °[facility] [address], [place] ° (336) 856-9996   °[facility] [address], [place]  (336) 288-8857   °[facility] [address] [place] [address], [place] (336) 299-7000   °Veita Bland [address], [place]  ° (336) 373-1557 Only accepts [place] Access Medicaid patients after they have their name applied to their card.  ° °Self-Pay (no insurance) in [place]: ° °Organization         Address  Phone   Notes  °Sickle Cell Patients, [facility] [address], [place] (336) 832-1970   °[facility] [address], [place] (336) 832-4400   °[facility] ° [address], [place] (336) 992-4800   °[facility]/Dr. Osei-Bonsu ° [address] [place] [address], [place] or [address], [place] (336) 841-8500 Phone number for both [place] and [place] locations is the same.  °[facility] [address], [place] (336) 299-0000   °[facility] [address] [place] [address], [place] or [address] (336) 852-7530 °(336) 878-2260   °[facility] [address], [place] (336) 350-1642, phone; (336) 294-5005, fax Sees patients 1st and 3rd Saturday of every month.  Must not qualify for public or private insurance (i.e. Medicaid, Medicare, [place] Health Choice, Veterans' Benefits) • Household income should be no more than 200% of the poverty level •The clinic cannot treat you if you are pregnant or think you are pregnant • Sexually transmitted diseases are not treated at the clinic.  ° ° °Dental Care: °Organization         Address  Phone  Notes  °[place] [facility] [address], [place] (336) 641-6152 Accepts children up to age 21 who are enrolled in Medicaid or [place] Health Choice; pregnant women with a Medicaid card; and children who have applied for Medicaid or [place] Health Choice, but were declined, whose parents can pay a reduced fee at time of service.  °[place]  [facility] [place]  [address], [place] (336) 641-7733 Accepts children up to age 21 who are enrolled in Medicaid or [place] Health Choice; pregnant women with a Medicaid card; and children who have applied for Medicaid or [place] Health Choice, but were declined, whose parents can pay a reduced fee at time of service.  °[facility] ° [address],  (336) 641-4533 Patients are seen by appointment only. Walk-ins are not accepted. [facility] will see patients 18 years of age and older. °Monday - Tuesday (8am-5pm) °Most Wednesdays (8:30-5pm) °$30 per visit, cash only  °[facility] ° [address], [place] (336) 641-4533 Patients are seen by appointment only. Walk-ins are not accepted. [facility] will see patients 18 years of age and older. °One   Wednesday Evening (Monthly: Volunteer Based).  $30 per visit, cash only  °[facility]  (919) 537-3737 for adults; Children under age 4, call Graduate Pediatric Dentistry at (919) [phone]. Children aged 4-14, please call (919) [phone] to request a pediatric application. ° Dental services are provided in all areas of dental care including fillings, crowns and bridges, complete and partial dentures, implants, gum treatment, root canals, and extractions. Preventive care is also provided. Treatment is provided to both adults and children. °Patients are selected via a lottery and there is often a waiting list. °  °[facility] [address], °[place] ° (336) 763-8833 [email or web address] °  °[facility] [address], [place], [place] (336)723-1848, Ext. 123 Second and Fourth Thursday of each month, opens at 6:30 AM; Clinic ends at 9 AM.  Patients are seen on a first-come first-served basis, and a limited number are seen during each clinic.  ° °[facility] ° [address], [place], [place] (336) 723-7904    Eligibility Requirements °You must have lived in [place], [place], or [place] counties for at least the last three months. °  You cannot be eligible for state or federal sponsored healthcare insurance, including Veterans Administration, Medicaid, or Medicare. °  You generally cannot be eligible for healthcare insurance through your employer.  °  How to apply: °Eligibility screenings are held every Tuesday and Wednesday afternoon from 1:00 pm until 4:00 pm. You do not need an appointment for the interview!  °[facility] [address], [place], [place] 336-631-2330   °[facility]  336-342-8273   °[facility]  336-703-3100   °[facility]  336-570-6415   ° °Behavioral Health Resources in the Community: °Intensive Outpatient Programs °Organization         Address  Phone  Notes  °[facility] [address], [place], [place] 336-878-6098   °[facility] [address], [place], [place] 336-832-9800   °[facility] [address], [place], [place] ° 336-882-2125   °[facility] [address],  °[place], [place] 1-800-853-5163 or 336-641-4981   °Substance Abuse Resources °Organization         Address  Phone  Notes  °[facility]  336-882-2125   °[facility]  336-784-9470   °[facility]  336-285-9073   °[facility]  336-845-3988   °Residential & Outpatient Substance Abuse Program  1-800-659-3381   °Psychological Services °Organization         Address  Phone  Notes  °[facility]  336- 832-9600   °[facility]  336- 378-7881   °[facility] [address], [place] 1-800-853-5163 or 336-641-4981   ° °Mobile Crisis Teams °Organization         Address  Phone  Notes  °[facility], Mobile Crisis Care Unit  1-877-626-1772   °Assertive °[facility] ° [address]  [place], [place] 336-834-9664   °Sharon DeEsch [address] °[place] 336-554-5454   ° °Self-Help/Support Groups °Organization         Address  Phone             Notes  °[facility] [place] - variety of support groups  336- 373-1402 Call for more information  °Narcotics Anonymous (NA), [facility] [address], °[place]   2 meetings at this location  ° °  Residential Treatment Programs °Organization         Address  Phone  Notes  °[facility] [address],    °[place]  1-866-801-8205   °[facility] ° [address], [place], [place] 704-293-8524   °[facility] [address], [place] 336-845-3988 Admissions: 8am-3pm M-F  °[facility] [address]    °[place], [place] 336-841-1104   °[facility] [address], [place], [place] 336-379-7146   °[facility] [address]  °[place], [place] 336-285-9073   °[facility] - Intensive Outpatient [address], [place], [place] 336-852-3033   °[facility] [address]  °[place], [place] 1-877-615-2722 or 336-784-9470   °[facility] [address] [place], [place] 336-227-7417 Accepts Medicaid  °[facility] [address]  °[place] 1-800-659-3381 Substance Abuse/Addiction Treatment  ° °[place] Behavioral Health Resources °Organization         Address  Phone  Notes  °[facility]  (888) 581-9988   °Julie Brannon, PhD [address] [place], [place]   (336) 349-5553 or (336) 951-0000   °[facility]   [address] °[place], [place] (336) 349-4454   °[facility] [address], [place], [place] (336) 342-8316 Insurance/Medicaid/sponsorship through [facility]  °[facility] [address]                                    [place], [place] (336) 342-8316 Therapy/tele-psych/case    °[facility] [address]  ° [place], [place] (336) 349-2233    °Dr. Arfeen  (336) 349-4544   °[facility] [place]  [employer] [place] [facility] 1) [address], [place] °2) [address], [place] °3)  [address], [place] (336) 349-3220 °(336) 342-7768 ° °(336) 342-8140   °[place] [facility] (336) 342-1394 or (336) 342-3537 (After Hours)    ° ° °

## 2014-11-26 NOTE — ED Notes (Signed)
pts friend at bedside

## 2014-11-26 NOTE — ED Notes (Signed)
Pt calm and resting quietly. Lying down in bed.  

## 2014-11-26 NOTE — ED Provider Notes (Signed)
CSN: [id number]     Arrival date & time 11/26/14  1509 History   First MD Initiated Contact with Patient 11/26/14 1623     Chief Complaint  Patient presents with  . ETOH detox    Rodney Sanchez is a 34 y.o. male with a history of alcohol abuse who presents to emergency department requesting alcohol detox today. He reports alcohol abuse for the past 12 years and currently drinks about six 40 ounce drinks/beers a day. He reports last drinking around 10 AM this morning. Patient reports drinking to feel "normal" and to prevent shaking. He reports vomiting once earlier today. He [facility] and was sober for 3 months at that time. He was last seen here in the ED one year ago. He denies history of seizures or hallucinations with withdrawal from alcohol. He denies suicidal or homicidal ideations. He denies paranoia, visual or auditory hallucinations. He denies fevers, chills, abdominal pain, nausea, dysuria, hematuria, seizures, chest pain or shortness of breath. He denies depressed mood or anxiety.   (Consider location/radiation/quality/duration/timing/severity/associated sxs/prior Treatment) HPI  History reviewed. No pertinent past medical history. Past Surgical History  Procedure Laterality Date  . Abdominal surgery      2001  . Ruptured spleen      2012   History reviewed. No pertinent family history. History  Substance Use Topics  . Smoking status: Current Every Day Smoker -- 1.00 packs/day    Types: Cigarettes  . Smokeless tobacco: Not on file  . Alcohol Use: Yes     Comment: beers daily    Review of Systems  Constitutional: Negative for fever and chills.  HENT: Negative for congestion and sore throat.   Eyes: Negative for visual disturbance.  Respiratory: Negative for cough, shortness of breath and wheezing.   Cardiovascular: Negative for chest pain and palpitations.  Gastrointestinal: Negative for nausea, vomiting, abdominal pain and  diarrhea.  Genitourinary: Negative for dysuria.  Musculoskeletal: Negative for back pain and neck pain.  Skin: Negative for rash.  Neurological: Negative for numbness and headaches.  Psychiatric/Behavioral: Negative for suicidal ideas and dysphoric mood. The patient is not nervous/anxious.       Allergies  Review of patient's allergies indicates no known allergies.  Home Medications   Prior to Admission medications   Medication Sig Start Date End Date Taking? Authorizing Provider  azithromycin (ZITHROMAX) 250 MG tablet Take 250 mg by mouth daily.    Historical Provider, MD   BP 130/94 mmHg  Pulse 84  Temp(Src) 97.7 F (36.5 C) (Oral)  Resp 16  SpO2 97% Physical Exam  Constitutional: He is oriented to person, place, and time. He appears well-developed and well-nourished. No distress.  HENT:  Head: Normocephalic and atraumatic.  Mouth/Throat: Oropharynx is clear and moist. No oropharyngeal exudate.  Eyes: Conjunctivae are normal. Pupils are equal, round, and reactive to light. Right eye exhibits no discharge. Left eye exhibits no discharge.  Neck: Neck supple.  Cardiovascular: Normal rate, regular rhythm, normal heart sounds and intact distal pulses.  Exam reveals no gallop and no friction rub.   No murmur heard. Pulmonary/Chest: Effort normal and breath sounds normal. No respiratory distress. He has no wheezes. He has no rales.  Abdominal: Soft. There is no tenderness.  Musculoskeletal: He exhibits no edema.  Lymphadenopathy:    He has no cervical adenopathy.  Neurological: He is alert and oriented to person, place, and time. Coordination normal.  Skin: Skin is warm and dry. No  rash noted. He is not diaphoretic. No erythema. No pallor.  Psychiatric: He has a normal mood and affect. His behavior is normal. His mood appears not anxious. Thought content is not paranoid. He does not exhibit a depressed mood. He expresses no homicidal and no suicidal ideation.  Nursing note and  vitals reviewed.   ED Course  Procedures (including critical care time) Labs Review Labs Reviewed  ACETAMINOPHEN LEVEL - Abnormal; Notable for the following:    Acetaminophen (Tylenol), Serum <10.0 (*)    All other components within normal limits  CBC - Abnormal; Notable for the following:    MCV 104.8 (*)    MCH 35.7 (*)    All other components within normal limits  COMPREHENSIVE METABOLIC PANEL - Abnormal; Notable for the following:    Potassium 3.4 (*)    Glucose, Bld 117 (*)    BUN <5 (*)    AST 141 (*)    ALT 163 (*)    All other components within normal limits  ETHANOL - Abnormal; Notable for the following:    Alcohol, Ethyl (B) 303 (*)    All other components within normal limits  SALICYLATE LEVEL  URINE RAPID DRUG SCREEN (HOSP PERFORMED)    Imaging Review No results found.   EKG Interpretation None      Filed Vitals:   11/26/14 1516  BP: 130/94  Pulse: 84  Temp: 97.7 F (36.5 C)  TempSrc: Oral  Resp: 16  SpO2: 97%     MDM   Meds given in ED:  Medications - No data to display  Discharge Medication List as of 11/26/2014  7:43 PM      Final diagnoses:  Alcohol intoxication, uncomplicated   This  is a 34 y.o. male with a history of alcohol abuse who presents to emergency department requesting alcohol detox today. He reports alcohol abuse for the past 12 years and currently drinks about six 40 ounce drinks/beers a day. He is seeking detox. He denies suicidal or homicidal ideations. He denies illicit drug use. He is alert and oriented 3. Patient's urine drug screen is negative. Alcohol level is 303. CBC is unremarkable. CMP indicates elevated liver enzymes with an AST of 141 ALT 163.  Awaiting on blood work the patient became agitated expresses a wish to go home. Dr. Juleen ChinaKohut spoke with the patient and he told him he wanted to leave and detox at home. The patient was discharged by Dr. Juleen ChinaKohut.     Lawana ChambersWilliam Duncan Beya Tipps, PA-C 11/27/14 0036  Raeford RazorStephen  Kohut, MD 11/28/14 [phone]

## 2014-11-26 NOTE — ED Notes (Signed)
Pt requesting ETOH detox. denies SI/HI, AH/VH. Reports ETOH abuse x12 years, drinks over 6 40oz drinks/day. Can go 8 hours without drinking and then goes into withdrawal. Last drink 1500 today 2/23. Denies drug use. Pt without doctor supervision stopped his xanax, zoloft, and seroquel 1 month ago. Pt has been vomiting this morning. Denies pain.

## 2014-11-26 NOTE — ED Notes (Signed)
Family took all pt belongings.

## 2014-11-27 ENCOUNTER — Encounter (HOSPITAL_COMMUNITY): Payer: Self-pay | Admitting: Emergency Medicine

## 2014-11-27 ENCOUNTER — Emergency Department (HOSPITAL_COMMUNITY)
Admission: EM | Admit: 2014-11-27 | Discharge: 2014-11-27 | Disposition: A | Discharge status: Home / Self Care | Payer: Self-pay | Attending: Emergency Medicine | Admitting: Emergency Medicine

## 2014-11-27 DIAGNOSIS — F1010 Alcohol abuse, uncomplicated: Secondary | ICD-10-CM | POA: Insufficient documentation

## 2014-11-27 DIAGNOSIS — Z720 Tobacco use: Secondary | ICD-10-CM | POA: Insufficient documentation

## 2014-11-27 DIAGNOSIS — Z7982 Long term (current) use of aspirin: Secondary | ICD-10-CM | POA: Insufficient documentation

## 2014-11-27 DIAGNOSIS — Z79899 Other long term (current) drug therapy: Secondary | ICD-10-CM | POA: Insufficient documentation

## 2014-11-27 MED ORDER — CHLORDIAZEPOXIDE HCL 25 MG PO CAPS: ORAL_CAPSULE | ORAL | Status: AC

## 2014-11-27 NOTE — Discharge Instructions (Signed)
Please take the medications as prescribed.  Please follow up with either [facility] or [facility] for your alcohol detoxification.  Alcohol Withdrawal Anytime drug use is interfering with normal living activities it has become abuse. This includes problems with family and friends. Psychological dependence has developed when your mind tells you that the drug is needed. This is usually followed by physical dependence when a continuing increase of drugs are required to get the same feeling or "high." This is known as addiction or chemical dependency. A person's risk is much higher if there is a history of chemical dependency in the family. Mild Withdrawal Following Stopping Alcohol, When Addiction or Chemical Dependency Has Developed When a person has developed tolerance to alcohol, any sudden stopping of alcohol can cause uncomfortable physical symptoms. Most of the time these are mild and consist of tremors in the hands and increases in heart rate, breathing, and temperature. Sometimes these symptoms are associated with anxiety, panic attacks, and bad dreams. There may also be stomach upset. Normal sleep patterns are often interrupted with periods of inability to sleep (insomnia). This may last for 6 months. Because of this discomfort, many people choose to continue drinking to get rid of this discomfort and to try to feel normal. Severe Withdrawal with Decreased or No Alcohol Intake, When Addiction or Chemical Dependency Has Developed About five percent of alcoholics will develop signs of severe withdrawal when they stop using alcohol. One sign of this is development of generalized seizures (convulsions). Other signs of this are severe agitation and confusion. This may be associated with believing in things which are not real or seeing things which are not really there (delusions and hallucinations). Vitamin deficiencies are usually present if alcohol intake has been long-term. Treatment for this most often  requires hospitalization and close observation. Addiction can only be helped by stopping use of all chemicals. This is hard but may save your life. With continual alcohol use, possible outcomes are usually loss of self respect and esteem, violence, and death. Addiction cannot be cured but it can be stopped. This often requires outside help and the care of professionals. Treatment centers are listed in the yellow pages under Cocaine, Narcotics, and Alcoholics Anonymous. Most hospitals and clinics can refer you to a specialized care center. It is not necessary for you to go through the uncomfortable symptoms of withdrawal. Your caregiver can provide you with medicines that will help you through this difficult period. Try to avoid situations, friends, or drugs that made it possible for you to keep using alcohol in the past. Learn how to say no. It takes a long period of time to overcome addictions to all drugs, including alcohol. There may be many times when you feel as though you want a drink. After getting rid of the physical addiction and withdrawal, you will have a lessening of the craving which tells you that you need alcohol to feel normal. Call your caregiver if more support is needed. Learn who to talk to in your family and among your friends so that during these periods you can receive outside help. Alcoholics Anonymous (AA) has helped many people over the years. To get further help, contact AA or call your caregiver, counselor, or clergyperson. Al-Anon and Alateen are support groups for friends and family members of an alcoholic. The people who love and care for an alcoholic often need help, too. For information about these organizations, check your phone directory or call a local alcoholism treatment center.  SEEK IMMEDIATE MEDICAL  CARE IF:   You have a seizure.  You have a fever.  You experience uncontrolled vomiting or you vomit up blood. This may be bright red or look like black coffee  grounds.  You have blood in the stool. This may be bright red or appear as a black, tarry, bad-smelling stool.  You become lightheaded or faint. Do not drive if you feel this way. Have someone else drive you or call [id number] for help.  You become more agitated or confused.  You develop uncontrolled anxiety.  You begin to see things that are not really there (hallucinate). Your caregiver has determined that you completely understand your medical condition, and that your mental state is back to normal. You understand that you have been treated for alcohol withdrawal, have agreed not to drink any alcohol for a minimum of 1 day, will not operate a car or other machinery for 24 hours, and have had an opportunity to ask any questions about your condition. Document Released: 06/30/2005 Document Revised: 12/13/2011 Document Reviewed: 05/08/2008 [facility] Patient Information 2015 [employer], [place]. This information is not intended to replace advice given to you by your health care provider. Make sure you discuss any questions you have with your health care provider.   Emergency Department Resource Guide 1) Find a Doctor and Pay Out of Pocket Although you won't have to find out who is covered by your insurance plan, it is a good idea to ask around and get recommendations. You will then need to call the office and see if the doctor you have chosen will accept you as a new patient and what types of options they offer for patients who are self-pay. Some doctors offer discounts or will set up payment plans for their patients who do not have insurance, but you will need to ask so you aren't surprised when you get to your appointment.  2) Contact Your Local Health Department Not all health departments have doctors that can see patients for sick visits, but many do, so it is worth a call to see if yours does. If you don't know where your local health department is, you can check in your phone book. The CDC also has a  tool to help you locate your state's health department, and many state websites also have listings of all of their local health departments.  3) Find a Walk-in Clinic If your illness is not likely to be very severe or complicated, you may want to try a walk in clinic. These are popping up all over the country in pharmacies, drugstores, and shopping centers. They're usually staffed by nurse practitioners or physician assistants that have been trained to treat common illnesses and complaints. They're usually fairly quick and inexpensive. However, if you have serious medical issues or chronic medical problems, these are probably not your best option.  No Primary Care Doctor: - Call Health Connect at  [phone] - they can help you locate a primary care doctor that  accepts your insurance, provides certain services, etc. - Physician Referral Service- [phone]  Chronic Pain Problems: Organization         Address  Phone   Notes  [facility]  [phone] Patients need to be referred by their primary care doctor.   Medication Assistance: Organization         Address  Phone   Notes  [facility] Medication [facility] [address] [place], [place] [address] [phone] --Must be a resident  of [place] -- Must have NO insurance coverage whatsoever (no Medicaid/ Medicare, etc.) -- The pt. MUST have a primary care doctor that directs their care regularly and follows them in the community   MedAssist  [phone]   [employer]  [phone]    Agencies that provide inexpensive medical care: Organization         Address  Phone   Notes  [place]  [phone]   [facility]    [phone]   [facility] [place], [place] [address] [phone]   [place] [address], [place] [phone]   Planned Parenthood    [phone]    [facility]    [phone]   [place] [address], [place] Phone:  [phone], Fax:  [phone] Hours of Operation:  9 am - 6 pm, M-F.  Also accepts Medicaid/Medicare and self-pay.  [facility] for [place] [address], [address], [place] Phone: [phone], Fax: [phone]. Hours of Operation:  8:30 am - 5:30 pm, M-F.  Also accepts Medicaid and self-pay.  [facility] [place] [address] [place] Phone: [phone]   [place], [place], [place] [phone], Ext. [phone] Mondays & Thursdays: 7-9 AM.  First 15 patients are seen on a first come, first serve basis.    Angels Providers:  Organization         Address  Phone   Notes  [facility] [address], [place] [phone] Also accepts self-pay patients.  [facility] [address], [place]  [phone]   [place], [address], [place] [phone]   [facility] [address], [place] [phone]   Lucianne Lei [address], [place]   [phone] Only accepts [place] Access [place] patients after they have their name applied to their card.   Self-Pay (no insurance) in [place] [facility]:  Organization         Address  Phone   Notes  Sickle Cell Patients, [facility] [place] [phone]   [facility] [place] [phone]   [facility] [place]  [place], [place], [place] [phone]   [facility]/Dr. Osei-Bonsu  [address] [place] or [address], [place] [phone] Phone number for both [place] and [address] locations is the same.  [facility] [address] [place] [phone]   [facility] [address] [place] or [address] [phone]   [facility] [address] [place] [phone], phone; [phone], fax Sees patients 1st and 3rd Saturday of every month.  Must not qualify for public or private insurance (i.e. Medicaid, Medicare,  Health Choice, Veterans' Benefits)  Household income should be no more than 200% of the poverty level The clinic cannot treat you if you are pregnant or think you are pregnant  Sexually transmitted diseases are not treated at the clinic.    Dental Care: Organization         Address  Phone  Notes  [facility] [place], [place] [phone] Accepts children up to age 56 who are enrolled in [place] or [place]; pregnant women with a Medicaid card; and children who have applied for Medicaid or [place] Health Choice, but were declined, whose parents can pay a reduced fee at time of service.  [facility]  [address], [place] [phone] Accepts children up to age 70 who are enrolled in [place] or [place]; pregnant women with a Medicaid card; and children who have applied for Medicaid or [place] Health Choice, but were declined, whose parents can pay a reduced fee at time of service.  [place] Adult Dental Access PROGRAM  [place] [phone] Patients are seen by appointment only. Walk-ins are not accepted. [place] will see patients 32 years of age and older. Monday - Tuesday (8am-5pm) Most Wednesdays (8:30-5pm) $30 per visit, cash only  [facility] Adult Dental Access PROGRAM  [address], [facility] [phone] Patients are seen by appointment only. Walk-ins are not accepted. [place] will see patients 90 years of age and older. One Wednesday  Evening (Monthly: Volunteer Based).  $30 per visit, cash only  [place]  [phone] for adults; Children under age 73, call Graduate Pediatric Dentistry at [phone]. Children aged 102-14, please call [phone] to request a pediatric application.  Dental services are provided in all areas of dental care including fillings, crowns and bridges, complete and partial dentures, implants, gum treatment, root canals, and extractions. Preventive care is also provided. Treatment is provided to both adults and children. Patients are selected via a lottery and there is often a waiting list.   [facility] [address] [place]  [phone] [email or web address]   [facility] [address] [place], [place] [phone], Ext. [phone] Second and Fourth Thursday of each month, opens at 6:30 AM; Clinic ends at 9 AM.  Patients are seen on a first-come first-served basis, and a limited number are seen during each clinic.   [facility]  [address] Hillard Danker [place], [place] [phone]   Eligibility Requirements You must have lived in [place], [place], or [place] counties for at least the last three months.   You cannot be eligible for state or federal sponsored [employer], including [employer], [place], or [employer].   You generally cannot be eligible for healthcare insurance through your employer.    How to apply: Eligibility screenings are held every Tuesday and Wednesday afternoon from 1:00 pm until 4:00 pm. You do not need an appointment for the interview!  [facility] [address], [place], [place]   [place]  [place] Department  [place]  [phone]    Behavioral Health Resources in the Community: Intensive Outpatient Programs Organization         Address  Phone  Notes  [place] Edna. [address], [place], [place] [phone]   [facility] Outpatient [address] [place], [place]   ADS: Alcohol & Drug Svcs [address] [place], [place]   [place] [address]  [place], [place] or [phone]   Substance Abuse Resources Organization  Address  Phone  Notes  [facility] Drug Services  [phone]   [facility]  [phone]   [facility]  [phone]   [facility]  [phone]   Residential & Outpatient Substance Abuse Program  [phone]   Psychological Services Organization         Address  Phone  Notes  [facility]  [phone]- [phone]   [facility]  [phone]- [phone]   [facility] [address], [place] [phone] or [phone]    Mobile Crisis Teams Organization         Address  Phone  Notes  [facility], Mobile Crisis Care Unit  [phone]   [facility]  [address] [place], [place] [phone]   Doristine LocksSharon DeEsch [address] [place] [phone]    Self-Help/Support Groups Organization         Address  Phone             Notes  [facility]ental Health Assoc. of [place] - variety of support groups  [phone]- [phone] Call for more information  Narcotics Anonymous (NA), [facility] [address], [place]  2 meetings at this location   Statisticianesidential Treatment Programs Organization         Address  Phone  Notes  [facility] [address],    [place]  [phone]   [facility]  [address], [place] [phone], [place], [place] [phone]   [facility] [address], [place] [phone] Admissions: 8am-3pm M-F  [facility] [address]    [place], [place] [phone]   [facility] [address],  [place], [place] [phone]   [facility] [address]  [place], [place] [phone]   [facility] - Intensive Outpatient [address], [place], [place] [phone]   [facility] ([facility] [address]  [place], [place] [phone] or [phone]   [facility] (RTS) [address] [place], [place] [phone] Accepts Medicaid  [facility] [address]  [place] [phone] Substance Abuse/Addiction Treatment   [facility] Behavioral Health Resources Organization         Address  Phone  Notes  [facility]  [phone](888) [phone]   Angie FavaJulie Brannon, PhD [address] [place], [place]   [phone](336) [phone] or [phone]   [facility]   [address] [place], [place] [phone]) [phone]   [facility] [address], [place], [place] [phone]) [phone] Insurance/Medicaid/sponsorship through [facility]  [facility] Families [address]                                    [place], [place] [phone] Therapy/tele-psych/case  [facility] [address].   [place], [place] [phone]) [phone]    Dr. Lolly MustacheArfeen  [phone]) [phone]   [facility] [place]  [employer] [facility] 1) [address], [place] 2) [address], [place] 3)  [address], [place] [phone](336) [phone]) [phone]  [phone]   [facility] Child Abuse Hotline [phone]) [phone] or [phone] (After Hours)

## 2014-11-27 NOTE — ED Notes (Signed)
Pt requesting detox from alcohol, got upset yesterday and left.

## 2014-11-27 NOTE — ED Provider Notes (Signed)
CSN: [id number]     Arrival date & time 11/27/14  1530 History   First MD Initiated Contact with Patient 11/27/14 1643     Chief Complaint  Patient presents with  . ETOH detox      (Consider location/radiation/quality/duration/timing/severity/associated sxs/prior Treatment) HPI Comments: Wants detox. Last drink just before arrival.  Patient is a 34 y.o. male presenting with alcohol problem. The history is provided by the patient.  Alcohol Problem This is a chronic problem. The current episode started more than 1 week ago. The problem occurs constantly. The problem has not changed since onset.Pertinent negatives include no chest pain and no abdominal pain. Nothing aggravates the symptoms. Nothing relieves the symptoms. He has tried nothing for the symptoms.    History reviewed. No pertinent past medical history. Past Surgical History  Procedure Laterality Date  . Abdominal surgery      2001  . Ruptured spleen      2012   No family history on file. History  Substance Use Topics  . Smoking status: Current Every Day Smoker -- 1.00 packs/day    Types: Cigarettes  . Smokeless tobacco: Not on file  . Alcohol Use: Yes     Comment: beers daily    Review of Systems  Constitutional: Negative for fever.  Respiratory: Negative for cough.   Cardiovascular: Negative for chest pain.  Gastrointestinal: Negative for abdominal pain.  All other systems reviewed and are negative.     Allergies  Review of patient's allergies indicates no known allergies.  Home Medications   Prior to Admission medications   Medication Sig Start Date End Date Taking? Authorizing Provider  aspirin-acetaminophen-caffeine (EXCEDRIN MIGRAINE) [phone] MG per tablet Take 1 tablet by mouth every 6 (six) hours as needed for migraine (migraine).   Yes Historical Provider, MD  B Complex Vitamins (VITAMIN B COMPLEX) TABS Take 1 tablet by mouth daily.   Yes Historical Provider, MD  Multiple Vitamins-Minerals  (MULTIVITAMIN & MINERAL PO) Take 1 tablet by mouth daily.   Yes Historical Provider, MD  chlordiazePOXIDE (LIBRIUM) 25 MG capsule 50mg  PO TID x 2 days, then 50mg  PO BID X 2 days, then 25 mg PO BID X 2 days, then 25 mg PO daily for 2 days. 11/27/14   Elwin MochaBlair Kiah Keay, MD   BP 156/89 mmHg  Pulse 82  Temp(Src) 98 F (36.7 C) (Oral)  Resp 16  SpO2 97% Physical Exam  Constitutional: He is oriented to person, place, and time. He appears well-developed and well-nourished. No distress.  HENT:  Head: Normocephalic and atraumatic.  Mouth/Throat: No oropharyngeal exudate.  Eyes: EOM are normal. Pupils are equal, round, and reactive to light.  Neck: Normal range of motion. Neck supple.  Cardiovascular: Normal rate and regular rhythm.  Exam reveals no friction rub.   No murmur heard. Pulmonary/Chest: Effort normal and breath sounds normal. No respiratory distress. He has no wheezes. He has no rales.  Abdominal: Soft. He exhibits no distension. There is no tenderness. There is no rebound.  Musculoskeletal: Normal range of motion. He exhibits no edema.  Neurological: He is alert and oriented to person, place, and time.  Skin: He is not diaphoretic.  Nursing note and vitals reviewed.   ED Course  Procedures (including critical care time) Labs Review Labs Reviewed - No data to display  Imaging Review No results found.   EKG Interpretation None      MDM   Final diagnoses:  Alcohol abuse    34 year old male here wanting detox of  alcohol. Chronic drinker, was seen here yesterday but left. Patient is not suicidal or homicidal. He was sober for 3 months/year, but began drinking again after things with his wife fell apart. He's tried detox multiple times but continues to fall back into drinking habits. Patient has never had a seizure with alcohol withdrawal. He does get the shakes really bad. Here afebrile, not suicidal or homicidal. He is well appearing, he did just drink before he got here. I  think he is a good candidate for outpatient detox. Given Librium taper and given resource guide. Stable for discharge.    Elwin Mocha, MD 11/27/14 [phone]

## 2014-12-09 ENCOUNTER — Emergency Department (HOSPITAL_COMMUNITY)
Admission: EM | Admit: 2014-12-09 | Discharge: 2014-12-09 | Disposition: A | Discharge status: Home / Self Care | Payer: Self-pay

## 2014-12-09 NOTE — ED Notes (Signed)
Patient called loudly for triage in all sections of the lobby. Patient not found in lobby. All people in lobby accounted for, none of them being a Suan HalterJeremy Spraker.
